# Patient Record
Sex: Female | Born: 1995 | Race: White | Hispanic: No | Smoking: Never smoker
Health system: Southern US, Community
[De-identification: ages and names within clinical notes are randomized; demographics above are authoritative.]

## PROBLEM LIST (undated history)

## (undated) DIAGNOSIS — E282 Polycystic ovarian syndrome: Secondary | ICD-10-CM

## (undated) DIAGNOSIS — N809 Endometriosis, unspecified: Secondary | ICD-10-CM

## (undated) HISTORY — PX: NO PAST SURGERIES: SHX2092

---
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
## 2018-01-31 ENCOUNTER — Encounter (HOSPITAL_COMMUNITY): Payer: Self-pay | Admitting: Emergency Medicine

## 2018-01-31 ENCOUNTER — Emergency Department (HOSPITAL_COMMUNITY)
Admission: EM | Admit: 2018-01-31 | Discharge: 2018-01-31 | Disposition: A | Discharge status: Home / Self Care | Payer: Self-pay | Attending: Emergency Medicine | Admitting: Emergency Medicine

## 2018-01-31 ENCOUNTER — Emergency Department (HOSPITAL_COMMUNITY): Payer: Self-pay

## 2018-01-31 DIAGNOSIS — R55 Syncope and collapse: Secondary | ICD-10-CM | POA: Insufficient documentation

## 2018-01-31 HISTORY — DX: Polycystic ovarian syndrome: E28.2

## 2018-01-31 LAB — CBG MONITORING, ED: Glucose-Capillary: 95 mg/dL (ref 65–99)

## 2018-01-31 LAB — CBC WITH DIFFERENTIAL/PLATELET
Abs Immature Granulocytes: 0 10*3/uL (ref 0.0–0.1)
BASOS ABS: 0.1 10*3/uL (ref 0.0–0.1)
BASOS PCT: 1 %
EOS ABS: 0.1 10*3/uL (ref 0.0–0.7)
Eosinophils Relative: 1 %
HCT: 43.7 % (ref 36.0–46.0)
Hemoglobin: 14.1 g/dL (ref 12.0–15.0)
IMMATURE GRANULOCYTES: 0 %
Lymphocytes Relative: 32 %
Lymphs Abs: 2.7 10*3/uL (ref 0.7–4.0)
MCH: 29.3 pg (ref 26.0–34.0)
MCHC: 32.3 g/dL (ref 30.0–36.0)
MCV: 90.9 fL (ref 78.0–100.0)
MONO ABS: 0.6 10*3/uL (ref 0.1–1.0)
MONOS PCT: 7 %
NEUTROS ABS: 5 10*3/uL (ref 1.7–7.7)
NEUTROS PCT: 59 %
PLATELETS: 316 10*3/uL (ref 150–400)
RBC: 4.81 MIL/uL (ref 3.87–5.11)
RDW: 12.3 % (ref 11.5–15.5)
WBC: 8.5 10*3/uL (ref 4.0–10.5)

## 2018-01-31 LAB — URINALYSIS, ROUTINE W REFLEX MICROSCOPIC
BILIRUBIN URINE: NEGATIVE
Glucose, UA: NEGATIVE mg/dL
KETONES UR: NEGATIVE mg/dL
NITRITE: NEGATIVE
PROTEIN: NEGATIVE mg/dL
Specific Gravity, Urine: 1.004 — ABNORMAL LOW (ref 1.005–1.030)
pH: 8 (ref 5.0–8.0)

## 2018-01-31 LAB — COMPREHENSIVE METABOLIC PANEL
ALK PHOS: 57 U/L (ref 38–126)
ALT: 13 U/L — AB (ref 14–54)
ANION GAP: 7 (ref 5–15)
AST: 21 U/L (ref 15–41)
Albumin: 3.5 g/dL (ref 3.5–5.0)
BILIRUBIN TOTAL: 0.5 mg/dL (ref 0.3–1.2)
BUN: <5 mg/dL — ABNORMAL LOW (ref 6–20)
CALCIUM: 8.8 mg/dL — AB (ref 8.9–10.3)
CO2: 26 mmol/L (ref 22–32)
CREATININE: 0.65 mg/dL (ref 0.44–1.00)
Chloride: 108 mmol/L (ref 101–111)
GFR calc non Af Amer: >60 mL/min (ref >60)
Glucose, Bld: 93 mg/dL (ref 65–99)
Potassium: 3.9 mmol/L (ref 3.5–5.1)
SODIUM: 141 mmol/L (ref 135–145)
TOTAL PROTEIN: 6.1 g/dL — AB (ref 6.5–8.1)

## 2018-01-31 LAB — I-STAT BETA HCG BLOOD, ED (MC, WL, AP ONLY): I-stat hCG, quantitative: <5 m[IU]/mL (ref <5)

## 2018-01-31 LAB — RAPID URINE DRUG SCREEN, HOSP PERFORMED
Amphetamines: NOT DETECTED
Benzodiazepines: NOT DETECTED
Cocaine: NOT DETECTED
Opiates: NOT DETECTED
Tetrahydrocannabinol: NOT DETECTED

## 2018-01-31 LAB — ETHANOL

## 2018-01-31 MED ORDER — KETOROLAC TROMETHAMINE 30 MG/ML IJ SOLN
15.0000 mg | Freq: Once | INTRAMUSCULAR | Status: AC
  Administered 2018-01-31: 15 mg via INTRAVENOUS
  Filled 2018-01-31: qty 1

## 2018-01-31 MED ORDER — SODIUM CHLORIDE 0.9 % IV BOLUS
1000.0000 mL | Freq: Once | INTRAVENOUS | Status: AC
  Administered 2018-01-31: 1000 mL via INTRAVENOUS

## 2018-01-31 MED ORDER — METOCLOPRAMIDE HCL 5 MG/ML IJ SOLN
10.0000 mg | Freq: Once | INTRAMUSCULAR | Status: AC
  Administered 2018-01-31: 10 mg via INTRAVENOUS
  Filled 2018-01-31: qty 2

## 2018-01-31 NOTE — ED Provider Notes (Signed)
[facility] EMERGENCY DEPARTMENT Provider Note   CSN: [id number] Arrival date & time: 01/31/18  1757     History   Chief Complaint Chief Complaint  Patient presents with  . Loss of Consciousness    HPI Phyllis Serrano is a 22 y.o. female.  HPI   Phyllis Serrano is a 22 y.o. female, with a history of PCOS, presenting to the ED with 2 syncopal episodes yesterday.  States she had been standing for an extended period of time, her vision began to close in, and she lost consciousness.  Witnesses told her she was unconscious for approximately 30 seconds and was alert and oriented upon waking.  She felt nauseous so bystanders immediately stood her up and tried to walk her to the bathroom, but patient then lost consciousness again for another 30 seconds.  Upon waking the second time, patient had no complaints.  Today, patient developed a frontal headache with pain in the region of the maxillary sinuses, throbbing, moderate, radiating posteriorly.  She endorses intermittent dizziness that she describes as the room spinning when she turns her head.  This sensation happens only occasionally.  She endorses poor oral intake today. Patient also complains of left posterior hip pain since her loss of consciousness yesterday.  Also endorses chills. Denies vision loss, confusion, weakness, numbness, neck pain/stiffness, back pain, changes in bowel or bladder function, chest pain, shortness of breath, fever, abdominal pain, vomiting, diarrhea, or any other complaints.     Past Medical History:  Diagnosis Date  . PCOS (polycystic ovarian syndrome)     There are no active problems to display for this patient.   History reviewed. No pertinent surgical history.   OB History   None      Home Medications    Prior to Admission medications   Not on File    Family History No family history on file.  Social History Social History   Tobacco Use  . Smoking status: Not on  file  Substance Use Topics  . Alcohol use: Not on file  . Drug use: Not on file     Allergies   Patient has no known allergies.   Review of Systems Review of Systems  Constitutional: Positive for chills. Negative for diaphoresis and fever.  Eyes: Negative for visual disturbance.  Respiratory: Negative for cough and shortness of breath.   Cardiovascular: Negative for chest pain.  Gastrointestinal: Negative for abdominal pain, diarrhea, nausea and vomiting.  Musculoskeletal: Negative for back pain and neck pain.  Neurological: Positive for syncope, light-headedness and headaches. Negative for weakness and numbness.  All other systems reviewed and are negative.    Physical Exam Updated Vital Signs BP 116/84 (BP Location: Left Arm)   Pulse (!) 109   Temp 98.3 F (36.8 C) (Oral)   Resp 18   Ht 5\' 7"  (1.702 m)   Wt 68 kg (150 lb)   LMP 01/17/2018   SpO2 100%   BMI 23.49 kg/m   Physical Exam  Constitutional: She is oriented to person, place, and time. She appears well-developed and well-nourished. No distress.  HENT:  Head: Normocephalic and atraumatic.  Mouth/Throat: Oropharynx is clear and moist.  Eyes: Pupils are equal, round, and reactive to light. Conjunctivae and EOM are normal.  Neck: Normal range of motion. Neck supple.  Cardiovascular: Normal rate, regular rhythm, normal heart sounds and intact distal pulses.  Pulmonary/Chest: Effort normal and breath sounds normal. No respiratory distress.  Abdominal: Soft. There is no  tenderness. There is no guarding.  Musculoskeletal: She exhibits no edema.  Normal motor function intact in all extremities and spine. No midline spinal tenderness.   Lymphadenopathy:    She has no cervical adenopathy.  Neurological: She is alert and oriented to person, place, and time.  No sensory deficits.  No noted speech deficits. No aphasia. Patient handles oral secretions without difficulty. No noted swallowing defects.  Equal grip  strength bilaterally. Strength 5/5 in the upper extremities. Strength 5/5 with flexion and extension of the hips, knees, and ankles bilaterally.  Patellar DTRs 2+ bilaterally. Negative Romberg. No gait disturbance. Coordination intact including heel to shin and finger to nose.  Cranial nerves III-XII grossly intact.  No facial droop.   Skin: Skin is warm and dry. Capillary refill takes less than 2 seconds. She is not diaphoretic.  Psychiatric: She has a normal mood and affect. Her behavior is normal.  Nursing note and vitals reviewed.    ED Treatments / Results  Labs (all labs ordered are listed, but only abnormal results are displayed) Labs Reviewed  URINALYSIS, ROUTINE W REFLEX MICROSCOPIC - Abnormal; Notable for the following components:      Result Value   Color, Urine STRAW (*)    Specific Gravity, Urine 1.004 (*)    Hgb urine dipstick SMALL (*)    Leukocytes, UA TRACE (*)    Bacteria, UA MANY (*)    All other components within normal limits  COMPREHENSIVE METABOLIC PANEL - Abnormal; Notable for the following components:   BUN <5 (*)    Calcium 8.8 (*)    Total Protein 6.1 (*)    ALT 13 (*)    All other components within normal limits  RAPID URINE DRUG SCREEN, HOSP PERFORMED - Abnormal; Notable for the following components:   Barbiturates   (*)    Value: Result not available. Reagent lot number recalled by manufacturer.   All other components within normal limits  CBC WITH DIFFERENTIAL/PLATELET  ETHANOL  CBG MONITORING, ED  I-STAT BETA HCG BLOOD, ED ([facility], [facility], [facility] ONLY)    EKG EKG Interpretation  Date/Time:  Sunday January 31 2018 18:02:12 EDT Ventricular Rate:  109 PR Interval:  130 QRS Duration: 76 QT Interval:  324 QTC Calculation: 436 R Axis:   44 Text Interpretation:  Sinus tachycardia Nonspecific T wave abnormality Abnormal ECG Confirmed by Loren RacerYelverton, David ([id number]) on 01/31/2018 7:54:59 PM    Radiology Dg Hip Unilat W Or Wo Pelvis 2-3 Views Left  Result  Date: 01/31/2018 CLINICAL DATA:  Fall yesterday, posterior left hip pain EXAM: DG HIP (WITH OR WITHOUT PELVIS) 2-3V LEFT COMPARISON:  None. FINDINGS: Hips are located. No evidence of pelvic fracture or sacral fracture. Dedicated view of the LEFT hip demonstrates no femoral neck fracture. IMPRESSION: No pelvic fracture or LEFT hip fracture. Electronically Signed   By: Genevive BiStewart  Edmunds M.D.   On: 01/31/2018 20:50    Procedures Procedures (including critical care time)  Medications Ordered in ED Medications  sodium chloride 0.9 % bolus 1,000 mL (0 mLs Intravenous Stopped 01/31/18 2138)  ketorolac (TORADOL) 30 MG/ML injection 15 mg (15 mg Intravenous Given 01/31/18 1946)  metoCLOPramide (REGLAN) injection 10 mg (10 mg Intravenous Given 01/31/18 1949)     Initial Impression / Assessment and Plan / ED Course  I have reviewed the triage vital signs and the nursing notes.  Pertinent labs & imaging results that were available during my care of the patient were reviewed by me and considered in my  medical decision making (see chart for details).  Clinical Course as of Feb 01 2203  Wynelle Link Jan 31, 2018  2018 Patient ambulated to the restroom without difficulty, need for assistance, or onset of symptoms.    [SJ]  2050 Patient continues to voice complete resolution in symptoms.    [SJ]  2105 Patient reevaluated and has no tachypnea.   Resp(!): 31 [SJ]  2105 This appears to be an erroneous reading due to patient positioning.  BP(!): 90/55 [SJ]  2130 Patient denies urinary symptoms.   Urinalysis, Routine w reflex microscopic(!) [SJ]    Clinical Course User Index [SJ] Joy, Shawn C, PA-C    Patient presents for evaluation following 2 syncopal episodes yesterday.  Suspect vasovagal cause for her syncope.  Patient has no noted high risk features to her syncope. Per [facility] syncope rule, patient is in the low risk group for serious outcome.  Patient's feelings of lightheadedness resolved with IV  fluids.  She will follow-up with PCP versus cardiology.  Findings and plan of care discussed with Loren Racer, MD.  Vitals:   01/31/18 2030 01/31/18 2100 01/31/18 2115 01/31/18 2130  BP: 108/79 (!) 90/55 101/71 101/73  Pulse:  89 92 88  Resp:  (!) 31 19 (!) 25  Temp:      TempSrc:      SpO2:  99% 100% 99%  Weight:      Height:         Orthostatic VS for the past 24 hrs:  BP- Lying Pulse- Lying BP- Sitting Pulse- Sitting BP- Standing at 0 minutes Pulse- Standing at 0 minutes  01/31/18 2104 103/64 83 106/72 81 109/75 84     Final Clinical Impressions(s) / ED Diagnoses   Final diagnoses:  Syncope and collapse    ED Discharge Orders    None       Concepcion Living 01/31/18 2205    Loren Racer, MD 02/09/18 [phone]

## 2018-01-31 NOTE — ED Triage Notes (Addendum)
Pt states hx of PCOS, takes metformin for this. Yesterday she had 2 syncopal episodes yesterday. Denies chest pain or shortness of breath prior. States she checked her blood sugars X 2 and they were normal, she is not diabetic. States she ate all day yesterday. Not on her period. Does endorse a headache to front of head. Pt also endorses nausea. LMP 2 weeks ago. Denies chest pain, abdominal pain, shortness of breath. Bystanders deny any seizure like activity. Pt reports the room is spinning.

## 2018-01-31 NOTE — ED Notes (Signed)
Pt placed on cardiac monitor per order.  NSR at this time.  Pt st's she feels better after receiving IV fluids

## 2018-01-31 NOTE — ED Notes (Addendum)
Lying  B/P  103/64  P  83 Sitting  B/P  106/72  P  81 Standing  B/P  109/75  P  84

## 2018-01-31 NOTE — Discharge Instructions (Addendum)
Findings with your lab results and trended vital signs were encouraging.  There was a nonspecific EKG abnormality noted. This will require nonemergent follow up. This may be done with a primary care provider or cardiology.  Be sure to stay well hydrated and eat regular, nutritious meals.  Remember to avoid locking your knees when standing.   Return to the ED should symptoms recur.

## 2019-08-16 ENCOUNTER — Encounter: Payer: Self-pay | Admitting: Physician Assistant

## 2019-08-16 ENCOUNTER — Ambulatory Visit: Payer: Self-pay | Admitting: Physician Assistant

## 2019-08-16 ENCOUNTER — Other Ambulatory Visit: Payer: Self-pay

## 2019-08-16 DIAGNOSIS — B3731 Acute candidiasis of vulva and vagina: Secondary | ICD-10-CM

## 2019-08-16 DIAGNOSIS — B373 Candidiasis of vulva and vagina: Secondary | ICD-10-CM

## 2019-08-16 DIAGNOSIS — Z113 Encounter for screening for infections with a predominantly sexual mode of transmission: Secondary | ICD-10-CM

## 2019-08-16 DIAGNOSIS — Z202 Contact with and (suspected) exposure to infections with a predominantly sexual mode of transmission: Secondary | ICD-10-CM

## 2019-08-16 DIAGNOSIS — F419 Anxiety disorder, unspecified: Secondary | ICD-10-CM

## 2019-08-16 LAB — WET PREP FOR TRICH, YEAST, CLUE: Trichomonas Exam: NEGATIVE

## 2019-08-16 MED ORDER — CLOTRIMAZOLE 1 % VA CREA: 1.0000 | TOPICAL_CREAM | Freq: Every day | VAGINAL | 0 refills | Status: AC

## 2019-08-16 MED ORDER — AZITHROMYCIN 500 MG PO TABS
1000.0000 mg | ORAL_TABLET | Freq: Once | ORAL | Status: AC
  Administered 2019-08-16: 1000 mg via ORAL
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
## 2019-08-26 ENCOUNTER — Telehealth: Payer: Self-pay

## 2019-08-26 DIAGNOSIS — A749 Chlamydial infection, unspecified: Secondary | ICD-10-CM

## 2019-08-26 NOTE — Telephone Encounter (Signed)
TC to patient. Verified ID via password/SS#. Informed of positive chlamydia. Reports tolerated tx well. Will RTC in 3 months for [facility] Campbell, RN

## 2019-09-15 ENCOUNTER — Encounter: Payer: Self-pay | Admitting: Licensed Clinical Social Worker

## 2019-09-15 ENCOUNTER — Ambulatory Visit: Payer: Self-pay | Admitting: Licensed Clinical Social Worker

## 2019-09-15 DIAGNOSIS — F411 Generalized anxiety disorder: Secondary | ICD-10-CM

## 2019-09-15 DIAGNOSIS — F410 Panic disorder [episodic paroxysmal anxiety] without agoraphobia: Secondary | ICD-10-CM
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
## 2019-09-27 ENCOUNTER — Ambulatory Visit: Payer: Self-pay | Admitting: Licensed Clinical Social Worker

## 2019-09-27 DIAGNOSIS — F411 Generalized anxiety disorder: Secondary | ICD-10-CM

## 2019-09-27 DIAGNOSIS — F410 Panic disorder [episodic paroxysmal anxiety] without agoraphobia: Secondary | ICD-10-CM

## 2019-09-27 NOTE — Progress Notes (Signed)
Counselor/Therapist Progress Note  Patient ID: Phyllis Serrano, MRN: [id number],    Date: 09/27/2019  Time Spent: 45 minutes   Treatment Type: Individual Therapy  Reported Symptoms: Obsessive thinking, Sleep disturbance and anxiety, anxious thought and worries  Mental Status Exam:  Appearance:   Casual     Behavior:  Appropriate and Sharing  Motor:  Normal  Speech/Language:   Normal Rate  Affect:  Appropriate  Mood:  normal  Thought process:  normal  Thought content:    WNL  Sensory/Perceptual disturbances:    WNL  Orientation:  oriented to person, place, time/date, situation and day of week  Attention:  Good  Concentration:  Good  Memory:  WNL  Fund of knowledge:   Good  Insight:    Good  Judgment:   Good  Impulse Control:  Good   Risk Assessment: Danger to Self:  No Self-injurious Behavior: No Danger to Others: No Duty to Warn:no Physical Aggression / Violence:No  Access to Firearms a concern: No  Gang Involvement:No   Subjective: Patient was engaged and cooperative throughout the session using time effectively to discuss thoughts, feelings, and treatment plan. Patient voices continued motivation for treatment and understanding of Anxiety and CBTs. Patient is likely to benefit from future treatment because she remains motivated to decrease anxiety symptoms and improve functioning.   Interventions: Cognitive Behavioral Therapy Established psychological safety. Checked in with patient and reviewed previous session, including assessment and goal of treatment. Reviewed CBTs. Explored patient's goal of treatment and worked collaboratively to develop CBT treatment plan. Provided Psychoeducation on mindfulness, engaged patient in mindfulness exercise, processed exercise, and contracted with patient to complete daily. Provided support through active listening, validation of feelings, and highlighted patient's strengths.  Diagnosis:   ICD-10-CM   1. Generalized anxiety disorder   F41.1   2. Panic attacks  F41.0     Plan: Patient's goal of treatment: learn to cope with things better, to feel better - to get back to "my" normal.   Treatment Target: Increase coping skills - Teach mindfulness-  focus on awareness of thoughts and feelings without attachment or judgment - Practice Mindfulness/acceptance meditation for anxiety/worry/ruminating thoughts - Self-care - nutrition, sleep, exercise   Treatment Target: Increase realistic balanced thinking  - Explore patient's thoughts, beliefs, automatic thoughts, assumptions  - Identify hot thoughts (upsetting ideas, self-talk and mental images) - Process distress and allow for emotional release  - Cognitive reframing  - Evaluate thoughts - Modify underlying beliefs  - Provided psychoeducation on core beliefs, explore, and assist patient in identifying core beliefs   Future Appointments  Date Time Provider Department Center  10/11/2019 10:30 AM Kathreen Cosier, LCSW [facility] None    Interpreter used: NA   Kathreen Cosier, LCSW

## 2019-10-11 ENCOUNTER — Ambulatory Visit: Payer: Self-pay | Admitting: Licensed Clinical Social Worker

## 2019-10-20 ENCOUNTER — Ambulatory Visit: Payer: Self-pay | Admitting: Licensed Clinical Social Worker

## 2019-10-20 DIAGNOSIS — F410 Panic disorder [episodic paroxysmal anxiety] without agoraphobia: Secondary | ICD-10-CM

## 2019-10-20 DIAGNOSIS — F411 Generalized anxiety disorder: Secondary | ICD-10-CM

## 2019-10-20 NOTE — Progress Notes (Signed)
Counselor/Therapist Progress Note  Patient ID: Phyllis Serrano, MRN: [id number],    Date: 10/20/2019  Time Spent: 42 minutes    Treatment Type: Individual Therapy  Reported Symptoms: overall stable mood and managed anxiety; one day of increased symptoms of anxiety, depressed mood  Mental Status Exam:  Appearance:   Casual     Behavior:  Appropriate and Sharing  Motor:  Normal  Speech/Language:   Normal Rate  Affect:  Appropriate  Mood:  normal  Thought process:  normal  Thought content:    WNL  Sensory/Perceptual disturbances:    WNL  Orientation:  oriented to person, place, time/date, situation and day of week  Attention:  Good  Concentration:  Good  Memory:  WNL  Fund of knowledge:   Good  Insight:    Good  Judgment:   Good  Impulse Control:  Good   Risk Assessment: Danger to Self:  No Self-injurious Behavior: No Danger to Others: No Duty to Warn:no Physical Aggression / Violence:No  Access to Firearms a concern: No  Gang Involvement:No   Subjective: Patient was engaged and cooperative throughout the session using time effectively to discuss thoughts and feelings. Patient voices continued motivation for treatment and understanding of anxiety. Patient is likely to benefit from future treatment because she remains motivated to manage anxiety symptoms and improve functioning and reports benefit of regular sessions in addressing these symptoms.   Interventions: Cognitive Behavioral Therapy Established psychological safety. Checked in with patient. Provided supportive space encouraging emotional release and processing of current psychosocial stressors - overall decrease in symptoms with one day of increased symptoms due to triggering event. Explored patient's experience of increased emotions, validating patient's feelings of worry and distress, identifying origins of these feelings. Explored things that are going well leading to decrease in symptoms. Reviewed mindfulness and  discussed ways of incorporating into daily life.  Engaged patient in mindfulness exercise, processed exercise. Provided support through active listening, validation of feelings, and highlighted patient's strengths.    Diagnosis:   ICD-10-CM   1. Generalized anxiety disorder  F41.1   2. Panic attacks  F41.0     Plan: Focus on Core beliefs    Patient's goal of treatment: learn to cope with things better, to feel better - to get back to "my" normal.   Treatment Target: Increase coping skills  Teach mindfulness- focus on awareness of thoughts and feelings without attachment or judgment  Practice Mindfulness/acceptance meditation for anxiety/worry/ruminating thoughts  Self-care - nutrition, sleep, exercise   Treatment Target: Increase realistic balanced thinking   Explore patient's thoughts, beliefs, automatic thoughts, assumptions   Identify hot thoughts(upsetting ideas, self-talk and mental images)  Process distress and allow for emotional release   Cognitive reframing   Evaluate thoughts  Modify underlying beliefs   Provide psychoeducation on core beliefs, explore, and assist patient in identifying core beliefs   Future Appointments  Date Time Provider Department Center  11/09/2019  3:40 PM Kathreen Cosier, LCSW [facility] None    Interpreter used: NA   Kathreen Cosier, LCSW

## 2019-11-07 ENCOUNTER — Ambulatory Visit: Payer: Self-pay | Admitting: Licensed Clinical Social Worker

## 2019-11-09 ENCOUNTER — Ambulatory Visit: Payer: Self-pay | Admitting: Licensed Clinical Social Worker

## 2019-11-09 DIAGNOSIS — F411 Generalized anxiety disorder: Secondary | ICD-10-CM

## 2019-11-09 NOTE — Progress Notes (Signed)
Counselor/Therapist Progress Note  Patient ID: Phyllis Serrano, MRN: [id number],    Date: 11/09/2019  Time Spent: 21 minutes   Treatment Type: Individual Therapy  Reported Symptoms: mild anxiety  Mental Status Exam:  Appearance:   NA     Behavior:  Appropriate and Sharing  Motor:  NA  Speech/Language:   Normal Rate  Affect:  NA  Mood:  normal  Thought process:  normal  Thought content:    WNL  Sensory/Perceptual disturbances:    WNL  Orientation:  oriented to person, place, time/date and situation  Attention:  Good  Concentration:  Good  Memory:  WNL  Fund of knowledge:   Good  Insight:    Good  Judgment:   Good  Impulse Control:  Good   Risk Assessment: Danger to Self:  No Self-injurious Behavior: No Danger to Others: No Duty to Warn:no Physical Aggression / Violence:No  Access to Firearms a concern: No  Gang Involvement:No   Subjective: Patient was engaged and cooperative throughout the session using time effectively to discuss thoughts and feelings. Patient voices continued motivation for treatment and understanding of anxiety issues. Patient voices improvement in symptoms and reports that she has met her goal of treatment.      Interventions: Cognitive Behavioral Therapy  Established psychological safety. Checked in with patient regarding current symptoms and psychosocial stressors. Reviewed previous session. Engaged patient in discussing progress in treatment and discussed termination of services. Encouraged patient to continue to practice mindfulness based strategies, live within her values, and to seek services in the future, as needed. Provided support through active listening, validation of feelings, and highlighted patient's strengths.    Diagnosis:   ICD-10-CM   1. Generalized anxiety disorder  F41.1     Plan: patient to seek services as needed.    No future appointments.  Interpreter used: NA   Milton Ferguson, LCSW

## 2020-02-07 ENCOUNTER — Inpatient Hospital Stay (HOSPITAL_COMMUNITY): Payer: Medicaid Other

## 2020-02-07 ENCOUNTER — Inpatient Hospital Stay (HOSPITAL_COMMUNITY)
Admission: AD | Admit: 2020-02-07 | Discharge: 2020-02-07 | Disposition: A | Discharge status: Home / Self Care | Payer: Medicaid Other | Attending: Obstetrics and Gynecology | Admitting: Obstetrics and Gynecology

## 2020-02-07 ENCOUNTER — Other Ambulatory Visit: Payer: Self-pay

## 2020-02-07 ENCOUNTER — Encounter (HOSPITAL_COMMUNITY): Payer: Self-pay | Admitting: Obstetrics and Gynecology

## 2020-02-07 DIAGNOSIS — O99281 Endocrine, nutritional and metabolic diseases complicating pregnancy, first trimester: Secondary | ICD-10-CM | POA: Diagnosis not present

## 2020-02-07 DIAGNOSIS — O00101 Right tubal pregnancy without intrauterine pregnancy: Secondary | ICD-10-CM | POA: Diagnosis present

## 2020-02-07 DIAGNOSIS — Z3A01 Less than 8 weeks gestation of pregnancy: Secondary | ICD-10-CM | POA: Insufficient documentation

## 2020-02-07 DIAGNOSIS — O4690 Antepartum hemorrhage, unspecified, unspecified trimester: Secondary | ICD-10-CM | POA: Diagnosis not present

## 2020-02-07 DIAGNOSIS — E282 Polycystic ovarian syndrome: Secondary | ICD-10-CM | POA: Insufficient documentation

## 2020-02-07 HISTORY — DX: Endometriosis, unspecified: N80.9

## 2020-02-07 LAB — WET PREP, GENITAL
Clue Cells Wet Prep HPF POC: NONE SEEN
Sperm: NONE SEEN
Trich, Wet Prep: NONE SEEN
Yeast Wet Prep HPF POC: NONE SEEN

## 2020-02-07 LAB — COMPREHENSIVE METABOLIC PANEL
ALT: 15 U/L (ref 0–44)
AST: 24 U/L (ref 15–41)
Albumin: 3.8 g/dL (ref 3.5–5.0)
Alkaline Phosphatase: 80 U/L (ref 38–126)
Anion gap: 7 (ref 5–15)
BUN: 17 mg/dL (ref 6–20)
CO2: 24 mmol/L (ref 22–32)
Calcium: 8.8 mg/dL — ABNORMAL LOW (ref 8.9–10.3)
Chloride: 106 mmol/L (ref 98–111)
Creatinine, Ser: 0.75 mg/dL (ref 0.44–1.00)
GFR calc Af Amer: >60 mL/min (ref >60)
GFR calc non Af Amer: >60 mL/min (ref >60)
Glucose, Bld: 96 mg/dL (ref 70–99)
Potassium: 4.3 mmol/L (ref 3.5–5.1)
Sodium: 137 mmol/L (ref 135–145)
Total Bilirubin: 0.8 mg/dL (ref 0.3–1.2)
Total Protein: 6.6 g/dL (ref 6.5–8.1)

## 2020-02-07 LAB — CBC
HCT: 42.9 % (ref 36.0–46.0)
Hemoglobin: 13.7 g/dL (ref 12.0–15.0)
MCH: 28.7 pg (ref 26.0–34.0)
MCHC: 31.9 g/dL (ref 30.0–36.0)
MCV: 89.9 fL (ref 80.0–100.0)
Platelets: 338 10*3/uL (ref 150–400)
RBC: 4.77 MIL/uL (ref 3.87–5.11)
RDW: 13.2 % (ref 11.5–15.5)
WBC: 9.5 10*3/uL (ref 4.0–10.5)
nRBC: 0 % (ref 0.0–0.2)

## 2020-02-07 LAB — ABO/RH
ABO/RH(D): A NEG
Antibody Screen: NEGATIVE

## 2020-02-07 LAB — HCG, QUANTITATIVE, PREGNANCY: hCG, Beta Chain, Quant, S: 831 m[IU]/mL — ABNORMAL HIGH (ref <5)

## 2020-02-07 LAB — POC URINE PREG, ED: Preg Test, Ur: POSITIVE — AB

## 2020-02-07 MED ORDER — RHO D IMMUNE GLOBULIN 1500 UNIT/2ML IJ SOSY
300.0000 ug | PREFILLED_SYRINGE | Freq: Once | INTRAMUSCULAR | Status: AC
  Administered 2020-02-07: 300 ug via INTRAMUSCULAR
  Filled 2020-02-07: qty 2

## 2020-02-07 MED ORDER — METHOTREXATE FOR ECTOPIC PREGNANCY
50.0000 mg/m2 | Freq: Once | INTRAMUSCULAR | Status: AC
  Administered 2020-02-07: 95 mg via INTRAMUSCULAR
  Filled 2020-02-07: qty 1

## 2020-02-07 NOTE — MAU Provider Note (Signed)
History     CSN: [id number]  Arrival date and time: 02/07/20 1515   First Provider Initiated Contact with Patient 02/07/20 1740      Chief Complaint  Patient presents with  . Vaginal Bleeding  . Abdominal Pain   Phyllis Serrano is a 24 y.o. G1P0 early pregnant who presents to MAU with complaints of vaginal bleeding and abdominal pain. Patient reports vaginal bleeding has been occurring on and off for the past 1.5 months. Patient reports having irregular cycles that usually occur every 5-6 weeks. Patient reports having a cycle like period every 2 weeks for the past 1.5 months. Describes as dark red vaginal bleeding, heavy like a cycle. She reports abdominal pain started occurring last week. She describes the pain as cramping that is specific to her RLQ. Patient reports that sometimes pain radiates to lower abdomen and to back. She denies abdominal pain currently. She denies N/V, urinary symptoms or any other complaints.    OB History    Gravida  1   Para      Term      Preterm      AB      Living        SAB      TAB      Ectopic      Multiple      Live Births              Past Medical History:  Diagnosis Date  . Endometriosis   . PCOS (polycystic ovarian syndrome)     Past Surgical History:  Procedure Laterality Date  . NO PAST SURGERIES      Family History  Problem Relation Age of Onset  . Alcohol abuse Father   . Anxiety disorder Father   . Depression Father     Social History   Tobacco Use  . Smoking status: Never Smoker  . Smokeless tobacco: Never Used  Vaping Use  . Vaping Use: Some days  Substance Use Topics  . Alcohol use: Yes    Comment: per patient 1 beer most days  . Drug use: Not Currently    Types: Marijuana    Comment: last smoked 2020    Allergies: No Known Allergies  Medications Prior to Admission  Medication Sig Dispense Refill Last Dose  . clotrimazole (CLOTRIMAZOLE-7) 1 % vaginal cream Place 1 Applicatorful vaginally  at bedtime. 45 g 0     Review of Systems  Constitutional: Negative.   Respiratory: Negative.   Cardiovascular: Negative.   Gastrointestinal: Positive for abdominal pain. Negative for constipation, diarrhea, nausea and vomiting.       No current abdominal pain   Genitourinary: Positive for vaginal bleeding. Negative for difficulty urinating, dysuria, frequency, pelvic pain, urgency and vaginal discharge.  Musculoskeletal: Negative.   Neurological: Negative.   Psychiatric/Behavioral: Negative.    Physical Exam   Blood pressure 114/73, pulse 90, temperature 98.2 F (36.8 C), temperature source Oral, resp. rate 20, height '5\' 6"'  (1.676 m), weight 73.4 kg, last menstrual period 02/02/2020, SpO2 98 %.  Physical Exam  Vitals reviewed. Constitutional: She is oriented to person, place, and time.  HENT:  Head: Normocephalic.  Cardiovascular: Normal rate and regular rhythm.  Respiratory: Effort normal and breath sounds normal. No respiratory distress. She has no wheezes.  GI: Soft. She exhibits no distension and no mass. There is abdominal tenderness. There is no guarding.  Genitourinary:    Genitourinary Comments: Pelvic exam: Cervix pink, visually closed, without lesion, moderate  amount of dark red vaginal bleeding, vaginal walls and external genitalia normal Bimanual exam: Cervix 0/long/high, firm, anterior, neg CMT, uterus nontender, nonenlarged, left adnexa without tenderness, enlargement, or mass, right adnexa with tenderness, no enlargement, or mass with palpation    Neurological: She is alert and oriented to person, place, and time.  Skin: Skin is warm and dry.  Psychiatric: Her behavior is normal. Mood normal.    MAU Course  Procedures  MDM Orders Placed This Encounter  Procedures  . Wet prep, genital  . US OB LESS THAN 14 WEEKS WITH OB TRANSVAGINAL  . CBC  . hCG, quantitative, pregnancy  . Comprehensive metabolic panel  . POC Urine Pregnancy, ED (not at [facility])  . ABO/Rh  .  Rh IG workup (includes ABO/Rh)  . Discharge patient   Labs and ultrasound results reviewed:  Results for orders placed or performed during the hospital encounter of 02/07/20 (from the past 24 hour(s))  POC Urine Pregnancy, ED (not at [facility])     Status: Abnormal   Collection Time: 02/07/20  4:06 PM  Result Value Ref Range   Preg Test, Ur POSITIVE (A) NEGATIVE  Wet prep, genital     Status: Abnormal   Collection Time: 02/07/20  5:48 PM   Specimen: PATH Cytology Cervicovaginal Ancillary Only  Result Value Ref Range   Yeast Wet Prep HPF POC NONE SEEN NONE SEEN   Trich, Wet Prep NONE SEEN NONE SEEN   Clue Cells Wet Prep HPF POC NONE SEEN NONE SEEN   WBC, Wet Prep HPF POC MANY (A) NONE SEEN   Sperm NONE SEEN   CBC     Status: None   Collection Time: 02/07/20  5:53 PM  Result Value Ref Range   WBC 9.5 4.0 - 10.5 K/uL   RBC 4.77 3.87 - 5.11 MIL/uL   Hemoglobin 13.7 12.0 - 15.0 g/dL   HCT 42.9 36 - 46 %   MCV 89.9 80.0 - 100.0 fL   MCH 28.7 26.0 - 34.0 pg   MCHC 31.9 30.0 - 36.0 g/dL   RDW 13.2 11.5 - 15.5 %   Platelets 338 150 - 400 K/uL   nRBC 0.0 0.0 - 0.2 %  ABO/Rh     Status: None   Collection Time: 02/07/20  5:53 PM  Result Value Ref Range   ABO/RH(D) A NEG    Antibody Screen NEG   hCG, quantitative, pregnancy     Status: Abnormal   Collection Time: 02/07/20  5:53 PM  Result Value Ref Range   hCG, Beta Chain, Quant, S 831 (H) <5 mIU/mL  Rh IG workup (includes ABO/Rh)     Status: None (Preliminary result)   Collection Time: 02/07/20  5:53 PM  Result Value Ref Range   Gestational Age(Wks) 6    ABO/RH(D) A NEG    Antibody Screen NEG    Unit Number [id number]    Blood Component Type RHIG    Unit division 00    Status of Unit ISSUED    Transfusion Status      OK TO TRANSFUSE Performed at [facility], [address], [place], [place] [address]   Comprehensive metabolic panel     Status: Abnormal   Collection Time: 02/07/20  6:09 PM  Result Value Ref Range    Sodium 137 135 - 145 mmol/L   Potassium 4.3 3.5 - 5.1 mmol/L   Chloride 106 98 - 111 mmol/L   CO2 24 22 - 32 mmol/L  Glucose, Bld 96 70 - 99 mg/dL   BUN 17 6 - 20 mg/dL   Creatinine, Ser 0.75 0.44 - 1.00 mg/dL   Calcium 8.8 (L) 8.9 - 10.3 mg/dL   Total Protein 6.6 6.5 - 8.1 g/dL   Albumin 3.8 3.5 - 5.0 g/dL   AST 24 15 - 41 U/L   ALT 15 0 - 44 U/L   Alkaline Phosphatase 80 38 - 126 U/L   Total Bilirubin 0.8 0.3 - 1.2 mg/dL   GFR calc non Af Amer >60 >60 mL/min   GFR calc Af Amer >60 >60 mL/min   Anion gap 7 5 - 15   US OB LESS THAN 14 WEEKS WITH OB TRANSVAGINAL  Result Date: 02/07/2020 CLINICAL DATA:  24 year old pregnant female with vaginal bleeding and right lower quadrant abdominal pain. Unknown LMP. EXAM: OBSTETRIC <14 WK [place] AND TRANSVAGINAL OB US TECHNIQUE: Both transabdominal and transvaginal ultrasound examinations were performed for complete evaluation of the gestation as well as the maternal uterus, adnexal regions, and pelvic cul-de-sac. Transvaginal technique was performed to assess early pregnancy. COMPARISON:  None. FINDINGS: The uterus is anteverted and appears unremarkable. The endometrium appears unremarkable and measures 2 mm in thickness. No intrauterine pregnancy identified. There is polycystic morphology of the ovaries. The right ovary measures 2.7 x 1.7 x 3.4 cm and the left ovary measures 2.4 x 2.2 x 3.0 cm. There is a 2.8 x 1.8 x 1.9 cm complex, predominantly solid mass in the region of the right adnexa which appears to be within a tubular structure. Small cystic or sac-like central component noted. No definite fetal pole identified. This is most concerning for an ectopic, likely tubal, pregnancy. Clinical correlation and obstetrical consult is advised. There is a small amount of free fluid within the pelvis. No pneumoperitoneum. IMPRESSION: 1. No intrauterine pregnancy. Findings most concerning for a right tubal ectopic pregnancy. 2. Polycystic ovarian morphology. 3. No  hemoperitoneum. These results were called by telephone at the time of interpretation on 02/07/2020 at 7:01 pm to provider [facility] , who verbally acknowledged these results. Electronically Signed   By: Anner Crete M.D.   On: 02/07/2020 19:04   Dr Quintella Reichert called CNM @ 1901 to discussed ultrasound results. Reviewed ultrasound results with Dr Harolyn Rutherford who agrees with MTX for ectopic pregnancy.   Discussed results of [place] with patient and reviewed need for MTX. Discussed importance of close follow up with patient and warning signs on reasons to present to MAU. Patient agrees to MTX. Patient is also A Neg and need rhogam.   RHogam given to patient '@1953' .  MTX given to patient '@2017' .  Discussed reasons to return to MAU. Follow up as scheduled in the office. Return to MAU as needed. Pt stable at time of discharge.   Meds ordered this encounter  Medications  . rho (d) immune globulin (RHIG/RHOPHYLAC) injection 300 mcg  . methotrexate [facility]) chemo injection kit 95 mg   Assessment and Plan   1. Right tubal pregnancy without intrauterine pregnancy   2. Vaginal bleeding during pregnancy    Discharge home Follow up in the office for stat HCG on Friday (day 4) then Monday (day 7) Return to MAU as needed for reasons discussed and/or emergencies  MTX precautions and strict ectopic precautions discussed    [place] [facility] [place] [facility]. Go on 02/10/2020.   Specialty: Obstetrics and Gynecology Contact information: [place] [phone]  Allergies as of 02/07/2020   No Known Allergies     Medication List    TAKE these medications   clotrimazole 1 % vaginal cream Commonly known as: Clotrimazole-7 Place 1 Applicatorful vaginally at bedtime.       Lajean Manes CNM 02/07/2020, 8:52 PM

## 2020-02-07 NOTE — ED Provider Notes (Signed)
MSE was initiated and I personally evaluated the patient and placed orders (if any) at  4:29 PM on February 07, 2020.  HPI: Patient is a 24 year old G1 P0 that presents due to 4 days of intermittent vaginal bleeding.  Patient has a history of PCOS as well as endometriosis.  Typically she has a menstrual cycle every 5 to 6 weeks.  She states about 6 weeks ago she started experiencing menstrual cycles every 2 weeks.  She states 4 days ago she began having pelvic cramping and vaginal bleeding which seemed like a typical menstrual cycle but she states is "heavier than normal".  She is unsure how many pads she is soaking in a day.  Patient states a relative told her to take a pregnancy test, which she did and notes that she has had 3+ pregnancy tests in the last 24 hours.  She denies any shortness of breath, lightheadedness, syncope at this time.  She has been experiencing her intermittent pelvic cramping but denies any currently.  PE: Patient is phonating clearly and coherently.  Lungs are clear to auscultation bilaterally.  Her heart is regular rate and rhythm.  She is not tachycardic on my exam.  Abdomen is soft and nontender in all 4 quadrants, including the suprapubic region.  No conjunctival pallor.  Pregnancy test is positive in the ED.  The patient appears stable.  I discussed this patient with the MAU team and they will accept her at this time.  Patient will be transferred.  Note: Portions of this report may have been transcribed using voice recognition software. Every effort was made to ensure accuracy; however, inadvertent computerized transcription errors may be present.     Placido Sou, PA-C 02/07/20 1638    Charlynne Pander, MD 02/07/20 [phone]

## 2020-02-07 NOTE — MAU Note (Signed)
Present with c/o right sided abdominal pain, VB, lower back cramps.  Unsure LMP, been bleeding evey 2 weeks past 1.5 months.

## 2020-02-07 NOTE — Discharge Instructions (Signed)
Methotrexate Treatment for an Ectopic Pregnancy, Care After This sheet gives you information about how to care for yourself after your procedure. Your health care provider may also give you more specific instructions. If you have problems or questions, contact your health care provider. What can I expect after the procedure? After the procedure, it is common to have:  Abdominal cramping.  Vaginal bleeding.  Fatigue.  Nausea.  Vomiting.  Diarrhea. Blood tests will be taken at timed intervals for several days or weeks to check your pregnancy hormone levels. The blood tests will be done until the pregnancy hormone can no longer be detected in the blood. Follow these instructions at home: Activity  Do not have sex until your health care provider approves.  Limit activities that take a lot of effort as told by your health care provider. Medicines  Take over the counter and prescription medicines only as told by your health care provider.  Do not take aspirin, ibuprofen, naproxen, or any other NSAIDs.  Do not take folic acid, prenatal vitamins, or other vitamins that contain folic acid. General instructions   Do not drink alcohol.  Follow instructions from your health care provider on how and when to report any symptoms that may indicate a ruptured ectopic pregnancy.  Keep all follow-up visits as told by your health care provider. This is important. Contact a health care provider if:  You have persistent nausea and vomiting.  You have persistent diarrhea.  You are having a reaction to the medicine, such as: ? Tiredness. ? Skin rash. ? Hair loss. Get help right away if:  Your abdominal or pelvic pain gets worse.  You have more vaginal bleeding.  You feel light-headed or you faint.  You have shortness of breath.  Your heart rate increases.  You develop a cough.  You have chills.  You have a fever. Summary  After the procedure, it is common to have symptoms  of abdominal cramping, vaginal bleeding and fatigue. You may also experience other symptoms.  Blood tests will be taken at timed intervals for several days or weeks to check your pregnancy hormone levels. The blood tests will be done until the pregnancy hormone can no longer be detected in the blood.  Limit strenuous activity as told by your health care provider.  Follow instructions from your health care provider on how and when to report any symptoms that may indicate a ruptured ectopic pregnancy. This information is not intended to replace advice given to you by your health care provider. Make sure you discuss any questions you have with your health care provider. Document Revised: 07/17/2017 Document Reviewed: 09/23/2016 Elsevier Patient Education  2020 [employer]  

## 2020-02-08 ENCOUNTER — Telehealth: Payer: Self-pay | Admitting: Family Medicine

## 2020-02-08 LAB — RH IG WORKUP (INCLUDES ABO/RH)
ABO/RH(D): A NEG
Antibody Screen: NEGATIVE
Gestational Age(Wks): 6
Unit division: 0

## 2020-02-08 LAB — GC/CHLAMYDIA PROBE AMP (~~LOC~~) NOT AT ARMC
Chlamydia: NEGATIVE
Comment: NEGATIVE
Comment: NORMAL
Neisseria Gonorrhea: NEGATIVE

## 2020-02-08 NOTE — Telephone Encounter (Signed)
Spoke with patient about her appointment. She is in agreement with the times.

## 2020-02-10 ENCOUNTER — Other Ambulatory Visit: Payer: Self-pay

## 2020-02-10 ENCOUNTER — Ambulatory Visit (INDEPENDENT_AMBULATORY_CARE_PROVIDER_SITE_OTHER): Payer: Self-pay | Admitting: *Deleted

## 2020-02-10 DIAGNOSIS — O00101 Right tubal pregnancy without intrauterine pregnancy: Secondary | ICD-10-CM

## 2020-02-10 LAB — BETA HCG QUANT (REF LAB): hCG Quant: 265 m[IU]/mL

## 2020-02-10 NOTE — Progress Notes (Signed)
Here for day 4 Stat BHCG  after methotrexate. States still having bleeding same as when she went to MAU.  Pain was less until yesterday- states both sides of pelvis hurting yesterday, today more on right side=3.  Discussed with Dr.Anyanwu and explained to patient that pain is expected as seperation of the ectopic is occurring and some pain is expected. Instructed her may take tylenol only if needed; if pain severe to go back to the hospital.  Explained today we should get results in approximately 2 hours and I will review with provider , then call her with results and plan of care. She voices understanding. Emarion Toral,RN

## 2020-02-10 NOTE — Progress Notes (Signed)
Patient was assessed and managed by nursing staff during this encounter. I have reviewed the chart and agree with the documentation and plan. I have also made any necessary editorial changes.  Jaynie Collins, MD 02/10/2020 6:04 PM

## 2020-02-10 NOTE — Progress Notes (Signed)
12:15 results returned and reviewed with Dr. Macon Large. I called Phyllis Serrano and notified her bhcg dropped to 265.We discussed the plan is to continue with stat bhcg on Monday as scheduled. We also discussed to go to the hospital if she has severe pain. She voices understanding. Masato Pettie,RN

## 2020-02-13 ENCOUNTER — Ambulatory Visit: Payer: Self-pay | Admitting: *Deleted

## 2020-02-13 VITALS — BP 103/65 | HR 68 | Wt 161.8 lb

## 2020-02-13 DIAGNOSIS — O00101 Right tubal pregnancy without intrauterine pregnancy: Secondary | ICD-10-CM

## 2020-02-13 LAB — BETA HCG QUANT (REF LAB): hCG Quant: 115 m[IU]/mL

## 2020-02-13 NOTE — Progress Notes (Signed)
Pt here for Kissy Cielo 7 stat BHCG after Methotrexate. Pt states she is still having bright red bleeding - same as reported 3 days ago. She does not wear pads so is not able to provide information regarding exact amount of bleeding. She is also passing small clots. She has some sharp, "twisting-like" RLQ pain which is pain scale 1. She is not taking any medication for the pain. Stat BHCG drawn. Pt advised she will be called with results and plan of care later today. She voiced understanding and stated that a detailed message can be left on voicemail since she will be at work.   1340  Called pt and informed her of test results which have been reviewed by Dr. Donavan Foil. He recommends that she have repeat BHCG in one week. Appt was given for 7/6 @ 1000. No [place] is indicated @ this time. Pt was reminded of ectopic precautions and she voiced understanding of all information given.

## 2020-02-13 NOTE — Progress Notes (Signed)
Patient was assessed and managed by nursing staff during this encounter. I have reviewed the chart and agree with the documentation and plan. I have also made any necessary editorial changes.  Warden Fillers, MD 02/13/2020 8:31 PM

## 2020-02-21 ENCOUNTER — Other Ambulatory Visit: Payer: Self-pay

## 2020-02-21 DIAGNOSIS — O00101 Right tubal pregnancy without intrauterine pregnancy: Secondary | ICD-10-CM

## 2020-02-22 ENCOUNTER — Telehealth: Payer: Self-pay | Admitting: Lactation Services

## 2020-02-22 LAB — BETA HCG QUANT (REF LAB): hCG Quant: 13 m[IU]/mL

## 2020-02-22 NOTE — Telephone Encounter (Signed)
-----   Message from Warden Fillers, MD sent at 02/22/2020  2:03 PM EDT ----- Good decrease in bhcg.  Would repeat again in 2 weeks

## 2020-02-22 NOTE — Telephone Encounter (Signed)
Called patient to let her know that her Hcg is down to 13. Reviewed that she needs another non stat level in 2 weeks. Patient voiced understanding. Message to front desk to call patient and schedule patient.   Patient voiced some pain to her right side with intercourse that goes away immediately post coitus. Reviewed there may still be some inflammation and to let us know if the pain does not go away in the future. She reports she is no longer bleeding at this time.

## 2020-03-07 ENCOUNTER — Other Ambulatory Visit: Payer: Self-pay | Admitting: General Practice

## 2020-03-07 ENCOUNTER — Other Ambulatory Visit: Payer: Self-pay

## 2020-03-07 DIAGNOSIS — O00101 Right tubal pregnancy without intrauterine pregnancy: Secondary | ICD-10-CM

## 2021-03-08 IMAGING — US US OB < 14 WEEKS - US OB TV
1 series · 15 of 28 positions shown · non-contrast
Comparison: None.

CLINICAL DATA: 23-year-old pregnant female with vaginal bleeding
and right lower quadrant abdominal pain. Unknown LMP.

EXAM:
OBSTETRIC <14 WK US AND TRANSVAGINAL OB US
TECHNIQUE: Both transabdominal and transvaginal ultrasound examinations were
performed for complete evaluation of the gestation as well as the
maternal uterus, adnexal regions, and pelvic cul-de-sac.
Transvaginal technique was performed to assess early pregnancy.

[Series 1: us ob < 14 weeks - us ob tv · 62 acquisitions, 15 frames shown]
[im 1/62]
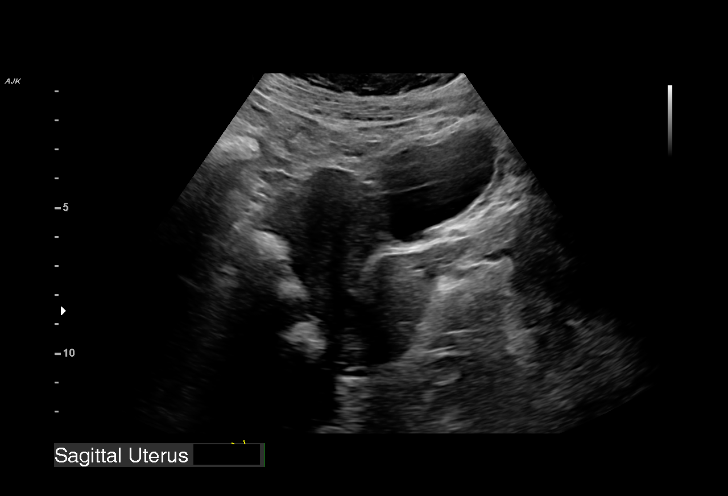
[im 5/62]
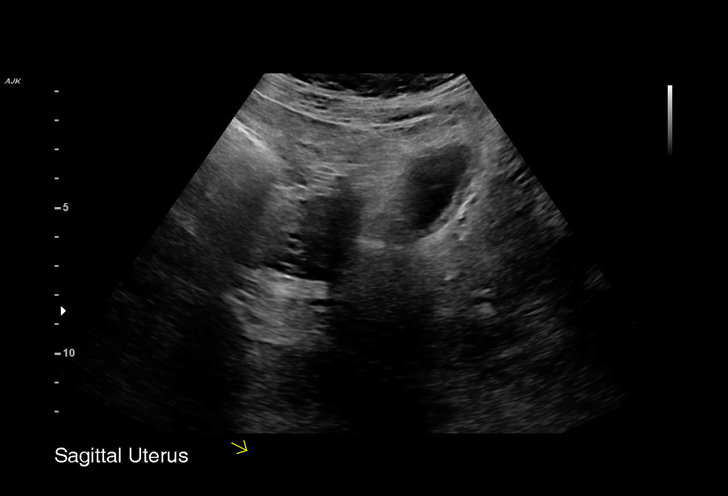
[im 10/62]
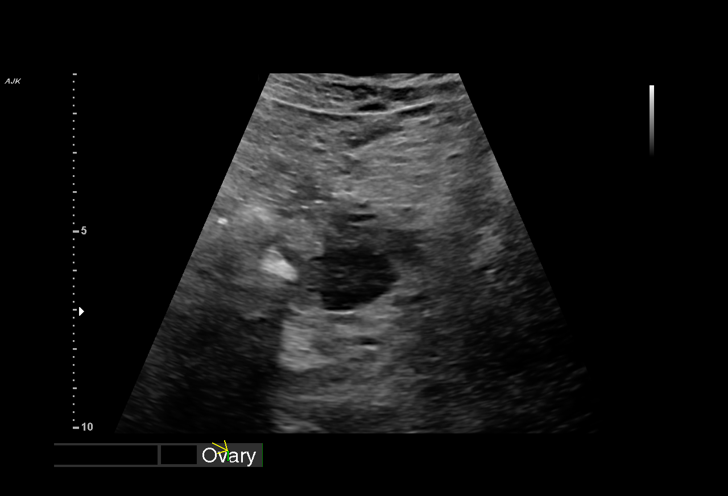
[im 14/62]
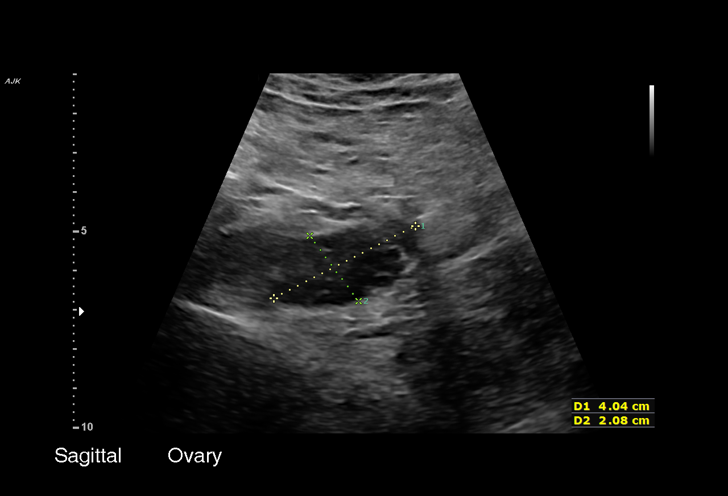
[im 19/62]
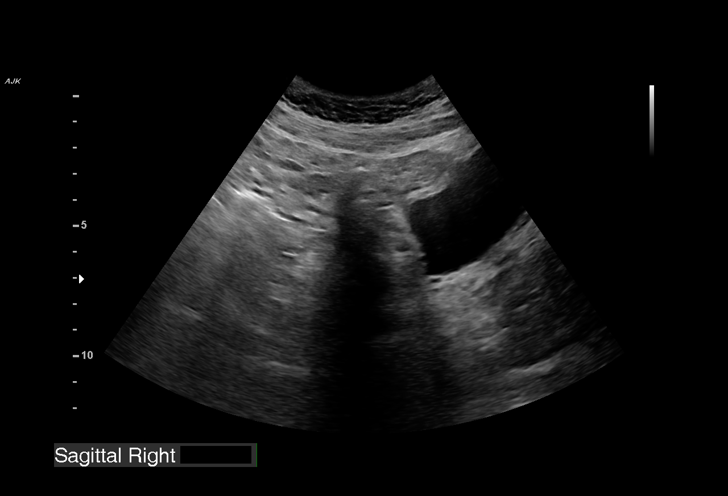
[im 23/62]
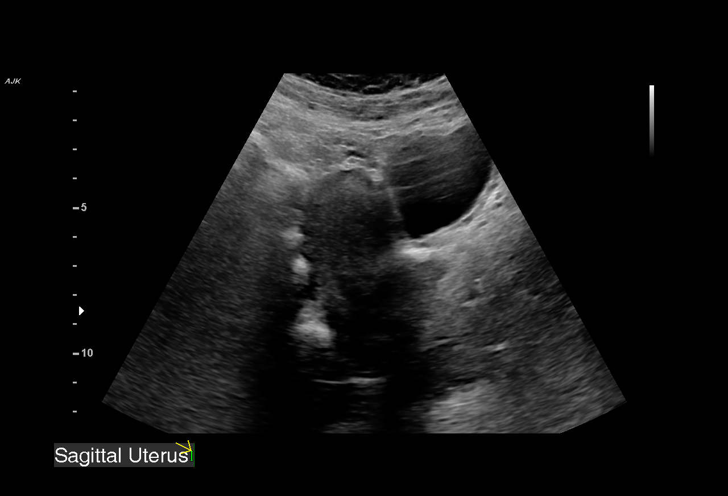
[im 28/62]
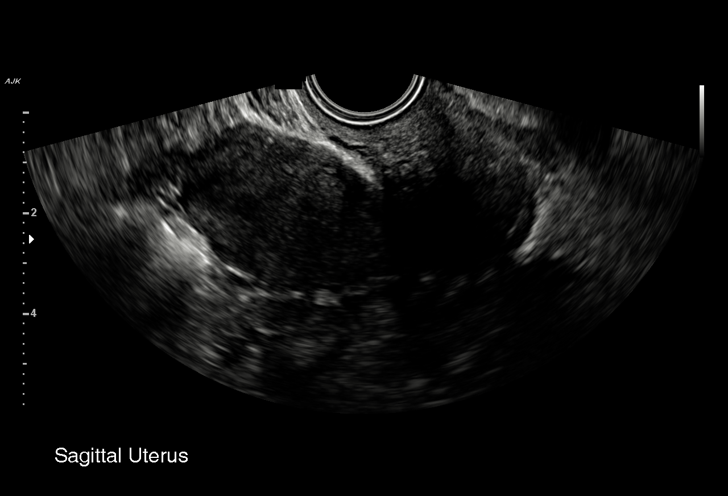
[im 32/62]
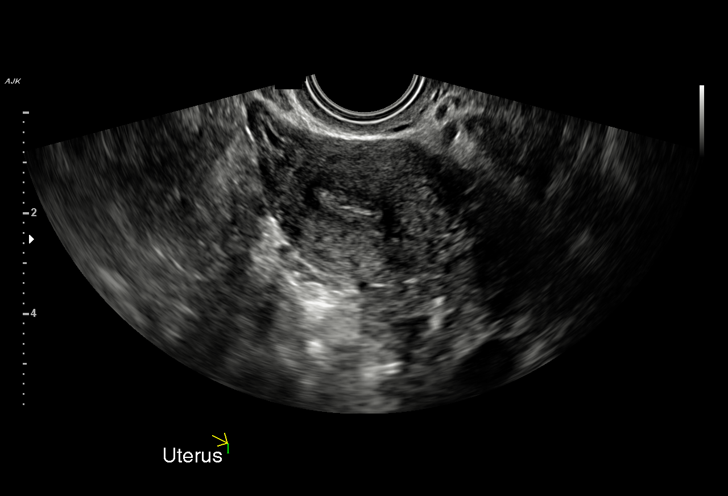
[im 34/62]
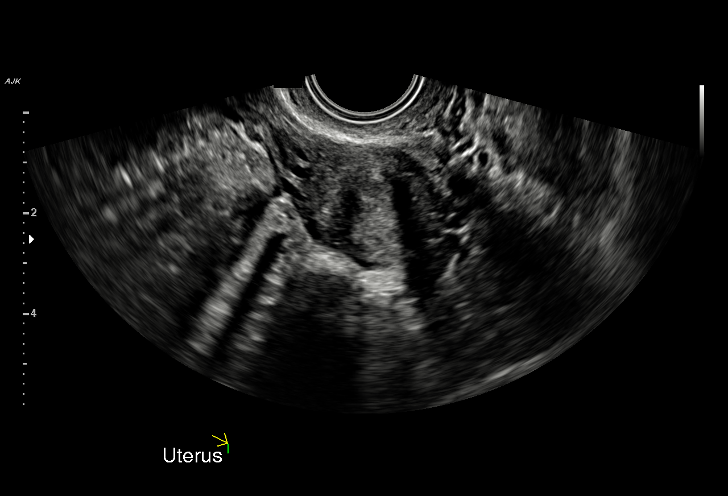
[im 39/62]
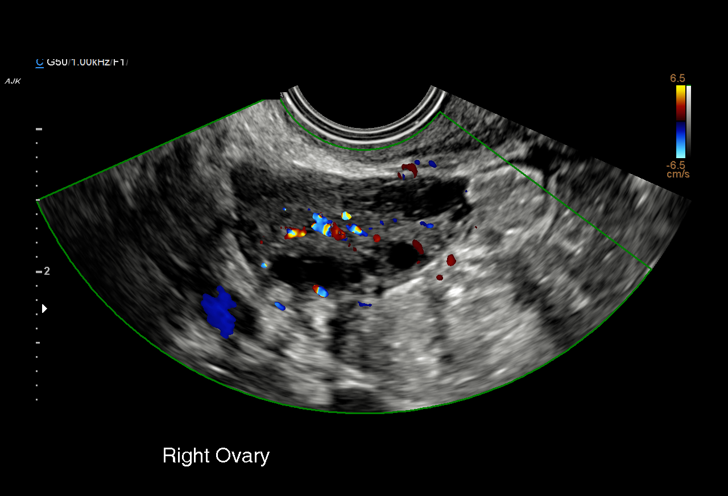
[im 43/62]
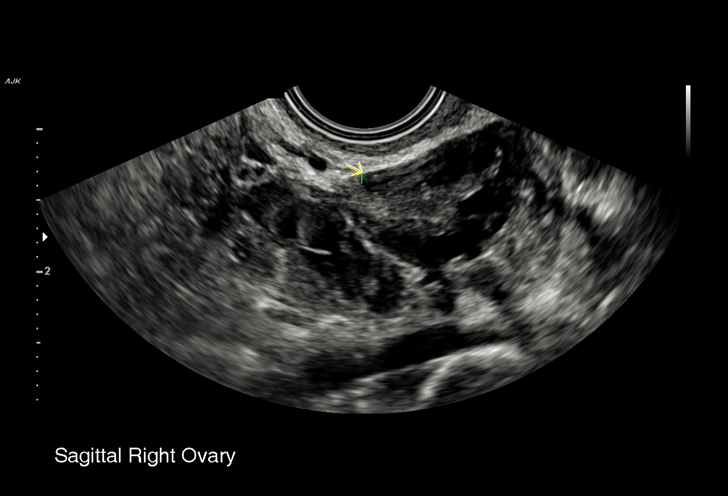
[im 48/62]
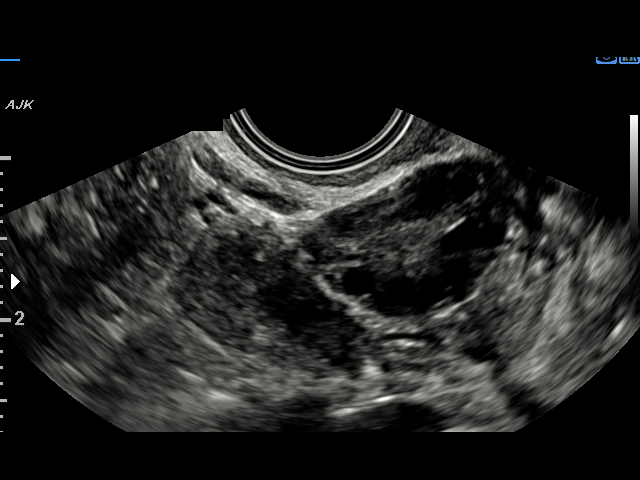
[im 52/62]
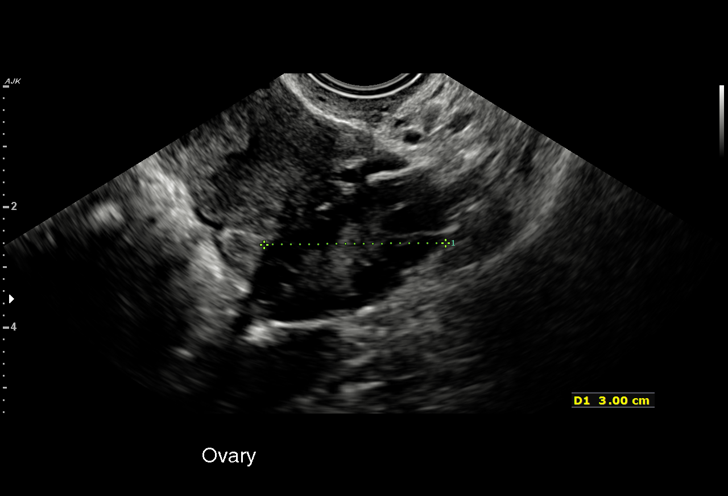
[im 57/62]
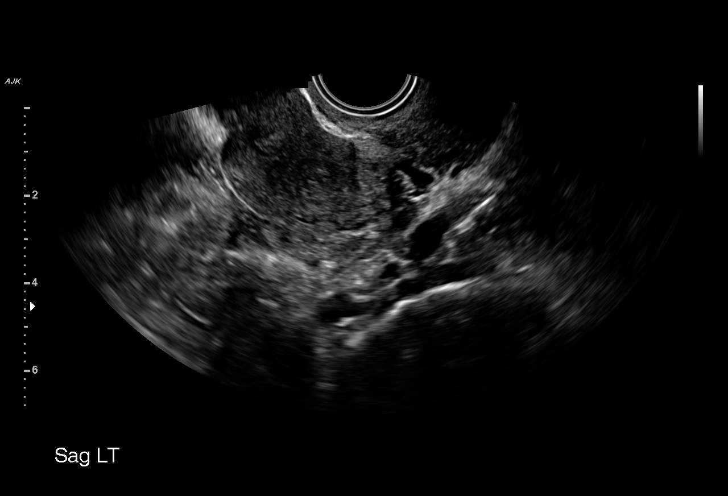
[im 62/62]
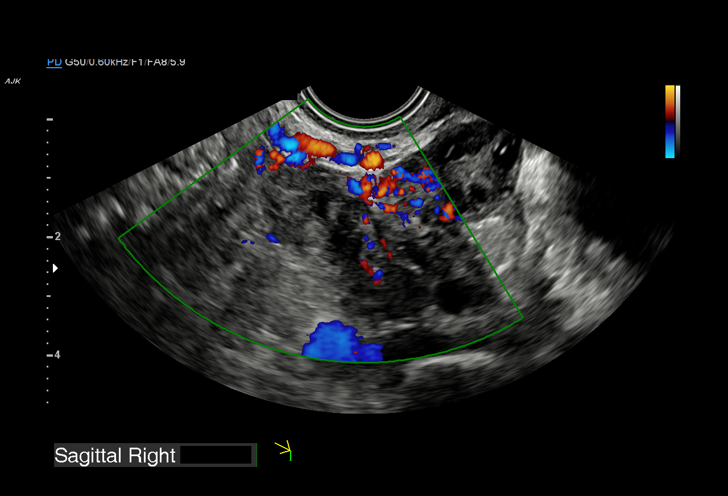

[15 of 28 positions shown; findings below may reference images not displayed]

FINDINGS: The uterus is anteverted and appears unremarkable.

The endometrium appears unremarkable and measures 2 mm in thickness.
No intrauterine pregnancy identified.

There is polycystic morphology of the ovaries. The right ovary
measures 2.7 x 1.7 x 3.4 cm and the left ovary measures 2.4 x 2.2 x
3.0 cm.

There is a 2.8 x 1.8 x 1.9 cm complex, predominantly solid mass in
the region of the right adnexa which appears to be within a tubular
structure. Small cystic or sac-like central component noted. No
definite fetal pole identified. This is most concerning for an
ectopic, likely tubal, pregnancy. Clinical correlation and
obstetrical consult is advised.

There is a small amount of free fluid within the pelvis. No
pneumoperitoneum.
IMPRESSION: 1. No intrauterine pregnancy. Findings most concerning for a right
tubal ectopic pregnancy.
2. Polycystic ovarian morphology.
3. No hemoperitoneum.

These results were called by telephone at the time of interpretation
on 02/07/2020 at [DATE] to provider SEBASTIEN SIVAKUMAR , who verbally
acknowledged these results.

## 2021-07-12 ENCOUNTER — Encounter: Payer: Self-pay | Admitting: Emergency Medicine

## 2021-07-12 ENCOUNTER — Emergency Department: Payer: Self-pay

## 2021-07-12 ENCOUNTER — Other Ambulatory Visit: Payer: Self-pay

## 2021-07-12 ENCOUNTER — Emergency Department
Admission: EM | Admit: 2021-07-12 | Discharge: 2021-07-12 | Disposition: A | Discharge status: Home / Self Care | Payer: Self-pay | Attending: Emergency Medicine | Admitting: Emergency Medicine

## 2021-07-12 DIAGNOSIS — W548XXA Other contact with dog, initial encounter: Secondary | ICD-10-CM | POA: Insufficient documentation

## 2021-07-12 DIAGNOSIS — S52615A Nondisplaced fracture of left ulna styloid process, initial encounter for closed fracture: Secondary | ICD-10-CM | POA: Insufficient documentation

## 2021-07-12 MED ORDER — TRAMADOL HCL 50 MG PO TABS: 50.0000 mg | ORAL_TABLET | Freq: Four times a day (QID) | ORAL | 0 refills | Status: AC | PRN

## 2021-07-12 MED ORDER — AMOXICILLIN-POT CLAVULANATE 875-125 MG PO TABS: 1.0000 | ORAL_TABLET | Freq: Two times a day (BID) | ORAL | 0 refills | Status: AC

## 2021-07-12 NOTE — ED Triage Notes (Signed)
Pt over via [facility]. Pt reports broke up a dog fight on Wednesday and hurt her left wrist in the process. Pt with swelling noted to left wrist and some puncture marks. Pt also with swelling to right hand. Pt reports was scratched in the process. Pt reports it was her dog and all immunizations are up to date.

## 2021-07-12 NOTE — ED Provider Notes (Signed)
[facility] Emergency Department Provider Note   ____________________________________________    I have reviewed the triage vital signs and the nursing notes.   HISTORY  Chief Complaint Arm Injury     HPI Phyllis Serrano is a 25 y.o. female who presents with complaints of left wrist pain.  Patient reports she was breaking up a dog fight and injured her left wrist 2 days ago, she thinks it may be broken.  She also reports puncture wounds from dog's, reports cleaning them as best he could.  No reports of infection or redness.  No fever  Past Medical History:  Diagnosis Date   Endometriosis    PCOS (polycystic ovarian syndrome)     Patient Active Problem List   Diagnosis Date Noted   Anxiety 08/16/2019    Past Surgical History:  Procedure Laterality Date   NO PAST SURGERIES      Prior to Admission medications   Medication Sig Start Date End Date Taking? Authorizing Provider  amoxicillin-clavulanate (AUGMENTIN) 875-125 MG tablet Take 1 tablet by mouth 2 (two) times daily for 7 days. 07/12/21 07/19/21 Yes Jene Every, MD  traMADol (ULTRAM) 50 MG tablet Take 1 tablet (50 mg total) by mouth every 6 (six) hours as needed. 07/12/21 07/12/22 Yes Jene Every, MD  clotrimazole (CLOTRIMAZOLE-7) 1 % vaginal cream Place 1 Applicatorful vaginally at bedtime. 08/16/19   Matt Holmes, PA     Allergies Patient has no known allergies.  Family History  Problem Relation Age of Onset   Alcohol abuse Father    Anxiety disorder Father    Depression Father     Social History Social History   Tobacco Use   Smoking status: Never   Smokeless tobacco: Never  Vaping Use   Vaping Use: Some days  Substance Use Topics   Alcohol use: Yes    Comment: per patient 1 beer most days   Drug use: Not Currently    Types: Marijuana    Comment: last smoked 2020    Review of Systems  Constitutional: No fever/chills     Gastrointestinal: No abdominal  pain.  No nausea, no vomiting.   Genitourinary: Negative for dysuria. Musculoskeletal: As above Skin: As above Neurological: Negative for headaches     ____________________________________________   PHYSICAL EXAM:  VITAL SIGNS: ED Triage Vitals  Enc Vitals Group     BP 07/12/21 1028 113/83     Pulse Rate 07/12/21 1028 93     Resp 07/12/21 1028 16     Temp 07/12/21 1028 98 F (36.7 C)     Temp Source 07/12/21 1028 Oral     SpO2 07/12/21 1028 99 %     Weight 07/12/21 1001 81.6 kg (180 lb)     Height 07/12/21 1001 1.676 m (5\' 6" )     Head Circumference --      Peak Flow --      Pain Score 07/12/21 1000 8     Pain Loc --      Pain Edu? --      Excl. in GC? --      Constitutional: Alert and oriented. No acute distress. Pleasant and interactive Eyes: Conjunctivae are normal.  Head: Atraumatic. Nose: No congestion/rhinnorhea. Mouth/Throat: Mucous membranes are moist.   Cardiovascular: Normal rate, regular rhythm.  Respiratory: Normal respiratory effort.  No retractions. Genitourinary: deferred Musculoskeletal: Tenderness along the medial portion of the left wrist, no bony abnormality palpated, mild swelling noted. Neurologic:  Normal speech and  language. No gross focal neurologic deficits are appreciated.   Skin:  Skin is warm, dry.  Small puncture wound to the right wrist, no evidence of infection   ____________________________________________   LABS (all labs ordered are listed, but only abnormal results are displayed)  Labs Reviewed - No data to display ____________________________________________  EKG   ____________________________________________  RADIOLOGY  X-ray reviewed by me, consistent with ulnar styloid fracture ____________________________________________   PROCEDURES  Procedure(s) performed: No  Procedures   Critical Care performed: No ____________________________________________   INITIAL IMPRESSION / ASSESSMENT AND PLAN / ED  COURSE  Pertinent labs & imaging results that were available during my care of the patient were reviewed by me and considered in my medical decision making (see chart for details).   Patient presents with wrist pain as above, x-ray demonstrates left ulnar styloid fracture, she is placed in a splint.  Patient we have started the patient on Augmentin given multiple puncture wounds from dog bite.   ____________________________________________   FINAL CLINICAL IMPRESSION(S) / ED DIAGNOSES  Final diagnoses:  Closed nondisplaced fracture of styloid process of left ulna, initial encounter      NEW MEDICATIONS STARTED DURING THIS VISIT:  Discharge Medication List as of 07/12/2021 11:13 AM     START taking these medications   Details  amoxicillin-clavulanate (AUGMENTIN) 875-125 MG tablet Take 1 tablet by mouth 2 (two) times daily for 7 days., Starting Fri 07/12/2021, Until Fri 07/19/2021, Normal    traMADol (ULTRAM) 50 MG tablet Take 1 tablet (50 mg total) by mouth every 6 (six) hours as needed., Starting Fri 07/12/2021, Until Sat 07/12/2022 at 2359, Normal         Note:  This document was prepared using Dragon voice recognition software and may include unintentional dictation errors.    Jene Every, MD 07/12/21 [phone]

## 2022-08-11 IMAGING — CR DG WRIST COMPLETE 3+V*L*
1 series · 4 of 4 positions shown · non-contrast
Comparison: None.

CLINICAL DATA: Pain, swelling following dog fight

EXAM:
LEFT WRIST - COMPLETE 3+ VIEW

[Series 1: dg wrist complete left · 0.14mm/px · 4 of 4 slices shown]
[im 1/4]
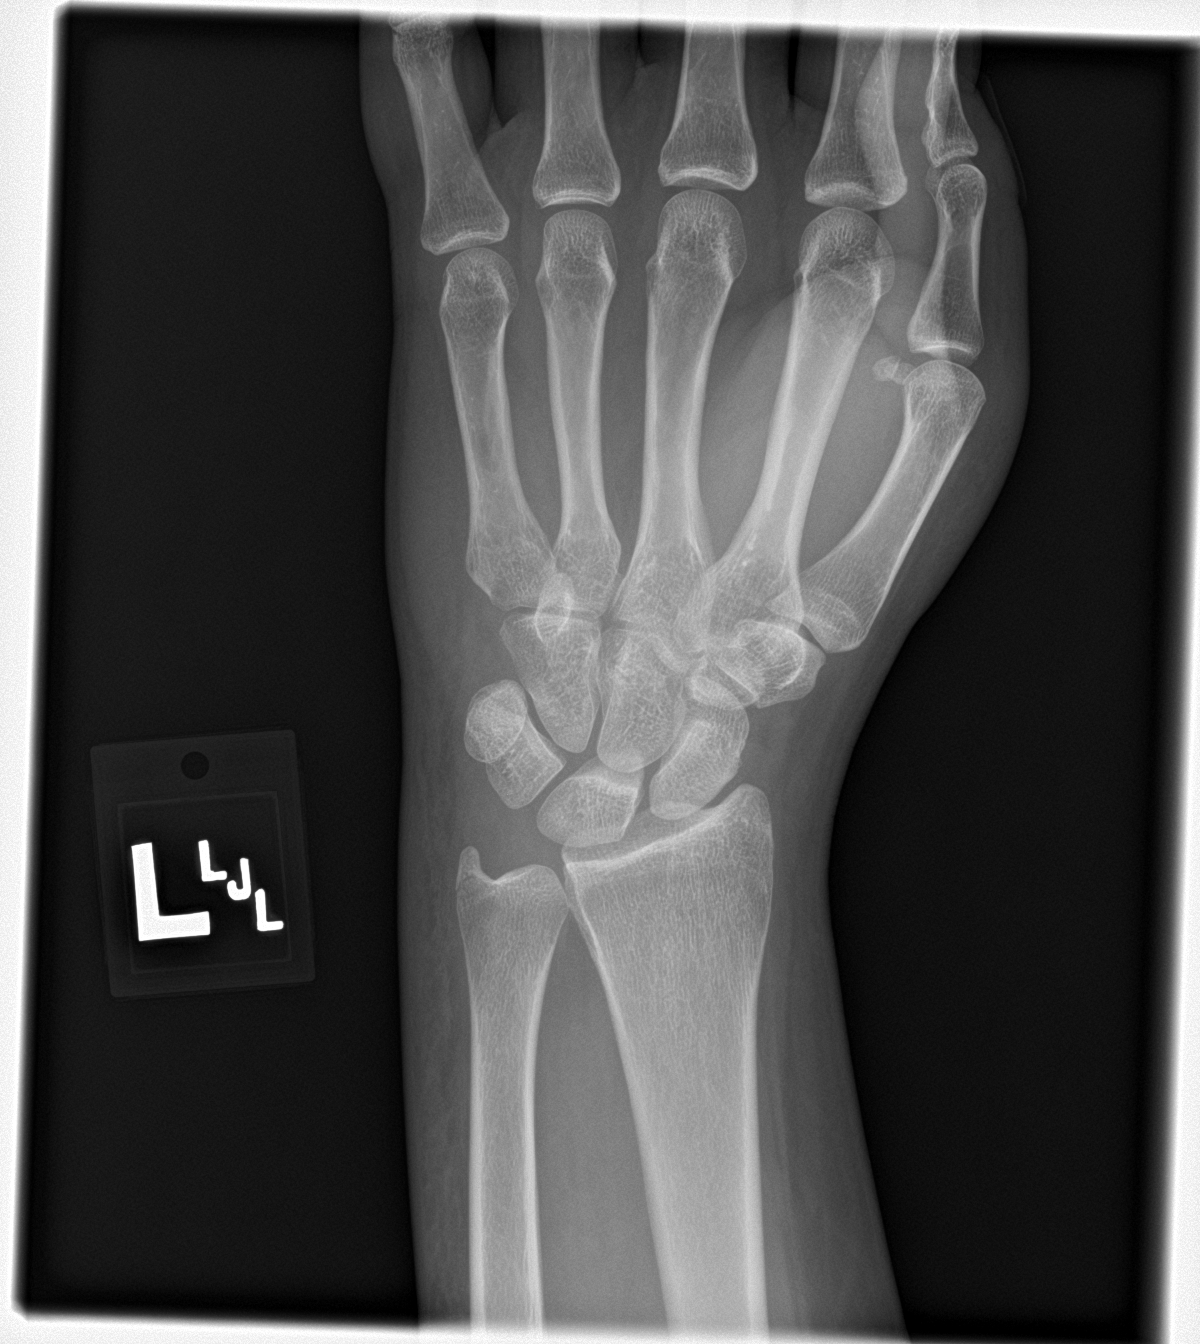
[im 2/4]
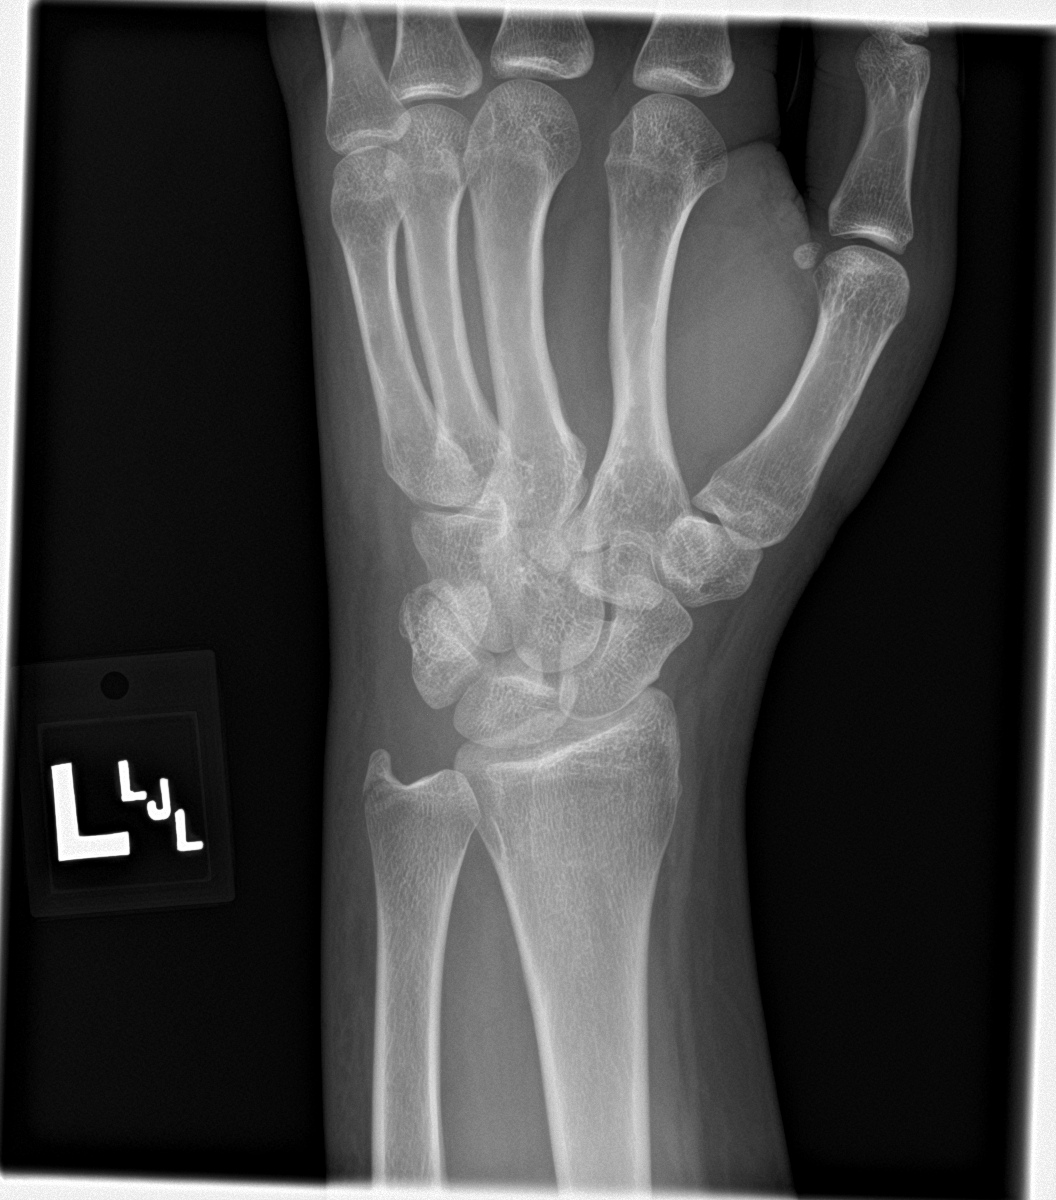
[im 3/4]
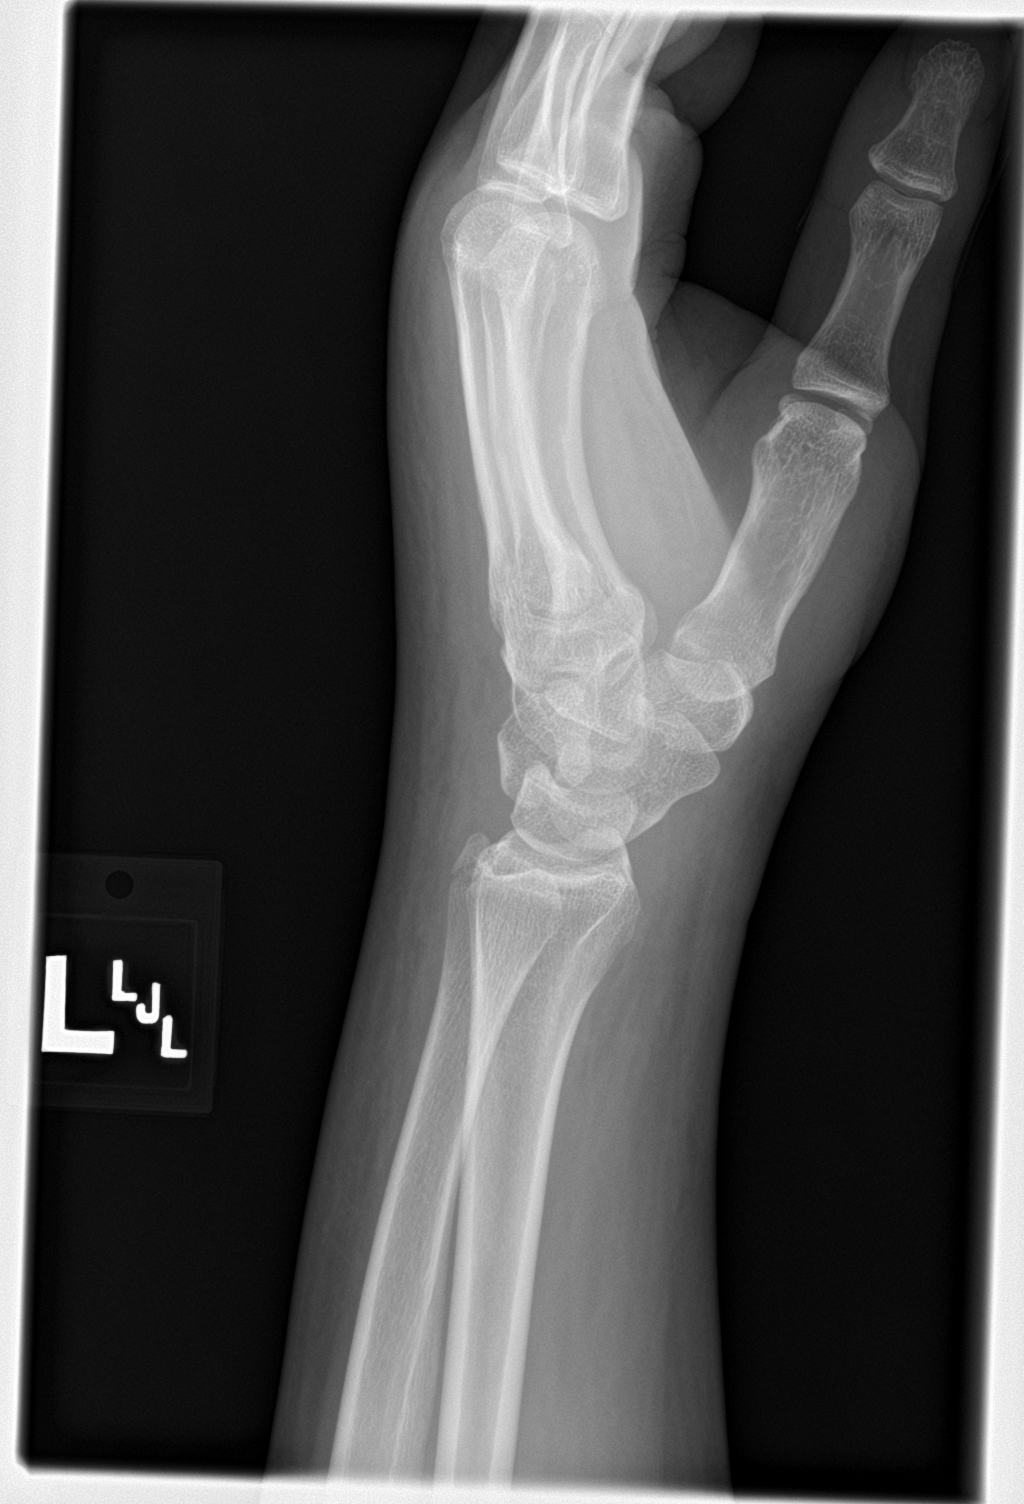
[im 4/4]
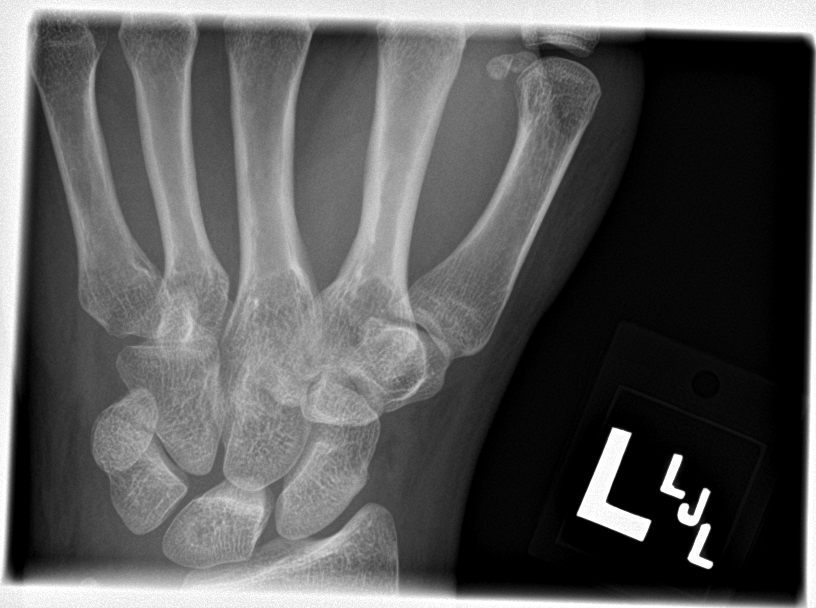

[4 of 4 positions shown; findings below may reference images not displayed]

FINDINGS: There is a nondisplaced fracture at the base of the ulnar styloid.
There is no other acute fracture. Alignment is normal. There is mild
soft tissue swelling over the ulnar side of the wrist. There is no
soft tissue gas. There is no radiopaque foreign body.
IMPRESSION: Nondisplaced fracture of the base of the ulnar styloid.

## 2023-04-11 ENCOUNTER — Other Ambulatory Visit: Payer: Self-pay

## 2023-04-11 ENCOUNTER — Emergency Department
Admission: EM | Admit: 2023-04-11 | Discharge: 2023-04-11 | Disposition: A | Discharge status: Home / Self Care | Payer: Self-pay | Attending: Emergency Medicine | Admitting: Emergency Medicine

## 2023-04-11 ENCOUNTER — Emergency Department: Payer: Self-pay

## 2023-04-11 DIAGNOSIS — Y92480 Sidewalk as the place of occurrence of the external cause: Secondary | ICD-10-CM | POA: Insufficient documentation

## 2023-04-11 DIAGNOSIS — X501XXA Overexertion from prolonged static or awkward postures, initial encounter: Secondary | ICD-10-CM | POA: Insufficient documentation

## 2023-04-11 DIAGNOSIS — Y9301 Activity, walking, marching and hiking: Secondary | ICD-10-CM | POA: Insufficient documentation

## 2023-04-11 DIAGNOSIS — S93402A Sprain of unspecified ligament of left ankle, initial encounter: Secondary | ICD-10-CM

## 2023-04-11 DIAGNOSIS — M7989 Other specified soft tissue disorders: Secondary | ICD-10-CM | POA: Insufficient documentation

## 2023-04-11 NOTE — ED Provider Notes (Signed)
[facility] Provider Note    Event Date/Time   First MD Initiated Contact with Patient 04/11/23 1336     (approximate)   History   Ankle Pain   HPI  Phyllis Serrano is a 27 y.o. female with no significant past medical history presents emergency department with left ankle injury that happened about 1 hour ago.  Patient was walking on a sidewalk when she hit the edge where there is no dirt and twisted the ankle and felt a pop.  Painful to bear weight.  No numbness or tingling.  No other injuries.      Physical Exam   Triage Vital Signs: ED Triage Vitals  Encounter Vitals Group     BP 04/11/23 1311 119/79     Systolic BP Percentile --      Diastolic BP Percentile --      Pulse Rate 04/11/23 1311 99     Resp 04/11/23 1311 16     Temp 04/11/23 1311 98.6 F (37 C)     Temp Source 04/11/23 1311 Oral     SpO2 04/11/23 1311 100 %     Weight 04/11/23 1313 190 lb (86.2 kg)     Height 04/11/23 1313 5\' 8"  (1.727 m)     Head Circumference --      Peak Flow --      Pain Score 04/11/23 1310 7     Pain Loc --      Pain Education --      Exclude from Growth Chart --     Most recent vital signs: Vitals:   04/11/23 1311  BP: 119/79  Pulse: 99  Resp: 16  Temp: 98.6 F (37 C)  SpO2: 100%     General: Awake, no distress.   CV:  Good peripheral perfusion. regular rate and  rhythm Resp:  Normal effort.  Abd:  No distention.   Other:  Left ankle with swelling at the lateral malleolus, tender to palpation, left foot is nontender, knee is nontender, neurovascular intact   ED Results / Procedures / Treatments   Labs (all labs ordered are listed, but only abnormal results are displayed) Labs Reviewed - No data to display   EKG     RADIOLOGY X-ray of the left ankle    PROCEDURES:   Procedures   MEDICATIONS ORDERED IN ED: Medications - No data to display   IMPRESSION / MDM / ASSESSMENT AND PLAN / ED COURSE  I reviewed the triage  vital signs and the nursing notes.                              Differential diagnosis includes, but is not limited to, sprain, fracture, contusion  Patient's presentation is most consistent with acute complicated illness / injury requiring diagnostic workup.   X-ray of the left ankle independently reviewed interpreted by me as being negative for any acute abnormality  I did explain the findings to the patient.  She was placed in a cam boot and given crutches.  Follow-up with orthopedics.  Return emergency department worsening.  Elevate and ice.  Take Tylenol and ibuprofen for pain as needed.  She is in agreement treatment plan.  Discharged stable condition.      FINAL CLINICAL IMPRESSION(S) / ED DIAGNOSES   Final diagnoses:  Sprain of left ankle, unspecified ligament, initial encounter     Rx / DC Orders   ED Discharge Orders  None        Note:  This document was prepared using Dragon voice recognition software and may include unintentional dictation errors.    Faythe Ghee, PA-C 04/11/23 1352    Dionne Bucy, MD 04/11/23 [phone]

## 2023-04-11 NOTE — ED Triage Notes (Signed)
Pt to ED for L ankle injury about 1 hour ago while walking on uneven surface, "heard a pop". Unsure if ankle twisted. Currently wrapped.

## 2023-04-11 NOTE — Discharge Instructions (Addendum)
Follow-up with one of the orthopedic doctors listed on your papers.  Elevate and ice.  Wear the boot when up and walking.  Use crutches as needed.  Alternate Tylenol and ibuprofen for pain as needed
